# Patient Record
Sex: Male | Born: 1980 | Race: Black or African American | Hispanic: No | Marital: Single | Smoking: Current every day smoker
Health system: Southern US, Community
[De-identification: ages and names within clinical notes are randomized; demographics above are authoritative.]

## PROBLEM LIST (undated history)

## (undated) DIAGNOSIS — F1910 Other psychoactive substance abuse, uncomplicated: Secondary | ICD-10-CM

## (undated) DIAGNOSIS — E119 Type 2 diabetes mellitus without complications: Secondary | ICD-10-CM

## (undated) HISTORY — PX: HAND SURGERY: SHX662

---

## 2006-07-11 ENCOUNTER — Emergency Department: Payer: Self-pay | Admitting: Emergency Medicine

## 2014-10-13 ENCOUNTER — Ambulatory Visit
Admission: EM | Admit: 2014-10-13 | Discharge: 2014-10-13 | Disposition: A | Discharge status: Home / Self Care | Payer: 59 | Attending: Internal Medicine | Admitting: Internal Medicine

## 2014-10-13 DIAGNOSIS — H109 Unspecified conjunctivitis: Secondary | ICD-10-CM | POA: Diagnosis not present

## 2014-10-13 MED ORDER — TRIAMCINOLONE ACETONIDE 55 MCG/ACT NA AERO: 2.0000 | INHALATION_SPRAY | Freq: Every day | NASAL | Status: AC

## 2014-10-13 MED ORDER — PREDNISONE 50 MG PO TABS: 50.0000 mg | ORAL_TABLET | Freq: Every day | ORAL | Status: DC

## 2014-10-13 MED ORDER — KETOTIFEN FUMARATE 0.025 % OP SOLN: 2.0000 [drp] | Freq: Two times a day (BID) | OPHTHALMIC | Status: AC

## 2014-10-13 MED ORDER — ERYTHROMYCIN 5 MG/GM OP OINT: TOPICAL_OINTMENT | Freq: Every day | OPHTHALMIC | Status: AC

## 2014-10-13 NOTE — ED Notes (Signed)
Signature pad in pt room is not working for pt to do [employer]

## 2014-10-13 NOTE — ED Provider Notes (Addendum)
CSN: [id number]     Arrival date & time 10/13/14  1051 History   First MD Initiated Contact with Patient 10/13/14 1212     Chief Complaint  Patient presents with  . Itchy Eye  . 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  He does not wear contacts.  He does have some history of seasonal allergies, typically with some eye symptoms. Symptoms actually started a week ago, with a little bit of redness and irritation, itching, which he has been managing with over-the-counter Visine allergy drops.  Symptoms were a little more severe yesterday, and this morning he awakened with his eyes crusted, itchy and sore, mild photophobia little bit of puffiness of the lids and very red conjunctiva bilaterally. Minimal white discharge.  Little bit of headache which she attributes to having some light sensitivity with his eyes. He does have runny and congested nose. No sore throat, no fever, no cough. No nausea/vomiting, no diarrhea.  History reviewed. No pertinent past medical history. History reviewed. No pertinent past surgical history. History reviewed. No pertinent family history. History  Substance Use Topics  . Smoking status: Never Smoker   . Smokeless tobacco: Not on file  . Alcohol Use: Yes     Comment: social     Review of Systems  All other systems reviewed and are negative.   Allergies  Review of patient's allergies indicates no known allergies.  Home Medications   Prior to Admission medications   Medication Sig Start Date End Date Taking? Authorizing Provider  erythromycin ophthalmic ointment Place into both eyes at bedtime. Place a 1/2 inch ribbon of ointment into the lower eyelids. 10/13/14   Eustace Moore, MD  ketotifen (ZADITOR) 0.025 % ophthalmic solution Place 2 drops into both eyes 2 (two) times daily. 10/13/14   Eustace Moore, MD  predniSONE (DELTASONE)  50 MG tablet Take 1 tablet (50 mg total) by mouth daily. 10/13/14   Eustace Moore, MD  triamcinolone (NASACORT AQ) 55 MCG/ACT AERO nasal inhaler Place 2 sprays into the nose daily. 10/13/14   Eustace Moore, MD   BP 147/84 mmHg  Pulse 82  Temp(Src) 98.4 F (36.9 C) (Tympanic)  Ht  (1.753 m)  Wt 220 lb (99.791 kg)  BMI 32.47 kg/m2  SpO2 100% Physical Exam  Constitutional: He is oriented to person, place, and time. No distress.  Alert, nicely groomed  HENT:  Head: Atraumatic.  Eyes:  Conjugate gaze. Bilateral marked diffuse injection of conjunctivae, with mild chemosis in the lower conjunctival sacs. Very mild puffiness/erythema of the upper and lower lids, scant crusting along the lid margins. Mucousy material present in small amounts in the lower conjunctival sacs. No corneal irregularity seen on penlight exam.  Pupils equal round and react to light.  Neck: Neck supple.  Cardiovascular: Normal rate.   Pulmonary/Chest: No respiratory distress.  Abdominal: Soft. He exhibits no distension.  Musculoskeletal: Normal range of motion.  Neurological: He is alert and oriented to person, place, and time.  Skin: Skin is warm and dry.  No cyanosis  Nursing note and vitals reviewed.    Visual Acuity  Right Eye Distance: 20/20 Left Eye Distance: 20/20 Bilateral Distance: 20/20   ED Course  Procedures   MDM   1. Conjunctivitis of both eyes    Seems to be mostly allergic, with components of possible rebound  hyperemia from recent extended Visine use, and possible mild superinfection. See prescriptions below. Patient is given a note for work for the next 2 days as suggested by his employer. Follow-up eye doctor if not improving in the next 2 or 3 days; Dr. Sharrell Ku office number is given. Anticipate gradual improvement over the next several days, may take 2-3 weeks to subside.  New Prescriptions   ERYTHROMYCIN OPHTHALMIC OINTMENT    Place into both eyes at bedtime. Place a 1/2  inch ribbon of ointment into the lower eyelids.   KETOTIFEN (ZADITOR) 0.025 % OPHTHALMIC SOLUTION    Place 2 drops into both eyes 2 (two) times daily.   PREDNISONE (DELTASONE) 50 MG TABLET    Take 1 tablet (50 mg total) by mouth daily.   TRIAMCINOLONE (NASACORT AQ) 55 MCG/ACT AERO NASAL INHALER    Place 2 sprays into the nose daily.       Eustace Moore, MD 10/13/14 1237  Eustace Moore, MD 10/13/14 1250

## 2014-10-13 NOTE — Discharge Instructions (Signed)
May also be a little infected; may also be very red due to recent visine eye drops (can cause rebound increase in redness when you stop using them). Prescriptions for prednisone; nasacort nasal steroid; ketotifen eye drops (for allergy); and erythromycin eye ointment (first dose now then use at bedtime) sent to the pharmacy. See the eye doctor if not improving in a few days.  Allergic Conjunctivitis A thin membrane (conjunctiva) covers the eyeball and underside of the eyelids. Allergic conjunctivitis happens when the thin membrane gets irritated from things like animal dander, pollen, perfumes, or smoke (allergens). The membrane may become puffy (swollen) and red. Small bumps may form on the inside of the eyelids. Your eyes may get teary, itchy, or burn. It cannot be passed to another person (contagious).  HOME CARE  Wash your hands before and after applying medicated drops or creams.  Do not touch the drop or cream tube to your eye or eyelids.  Do not use your soft contacts. Throw them away. Use a new pair once recovery is complete.  Do not use your hard contacts. They need to be washed (sterilized) thoroughly after recovery is complete.  Put a cold cloth to your eye(s) if you have itching and burning. GET HELP RIGHT AWAY IF:   You are not feeling better in 2 to 3 days after treatment.  Your lids are sticky or stick together.  Fluid comes from the eye(s).  You become sensitive to light.  You have a temperature by mouth above 102 F (38.9 C).  You have pain in and around the eye(s).  You start to have vision problems. MAKE SURE YOU:   Understand these instructions.  Will watch your condition.  Will get help right away if you are not doing well or get worse. Document Released: 08/22/2009 Document Revised: 05/27/2011 Document Reviewed: 08/22/2009 [facility] Patient Information 2015 [place], [place]. This information is not intended to replace advice given to you by your health  care provider. Make sure you discuss any questions you have with your health care provider.

## 2014-10-13 NOTE — ED Notes (Signed)
Pt states " I woke up with both eyes red, I think I had pink eye last week but was relieved with over the counter drops, today it is back. Painful, watery and itchy." Visual acuity normal.

## 2017-11-13 ENCOUNTER — Inpatient Hospital Stay (HOSPITAL_COMMUNITY)
Admission: EM | Admit: 2017-11-13 | Discharge: 2017-11-15 | DRG: 639 | Disposition: A | Discharge status: Rehabilitation Facility | Payer: 59 | Attending: Family Medicine | Admitting: Family Medicine

## 2017-11-13 ENCOUNTER — Encounter (HOSPITAL_COMMUNITY): Payer: Self-pay | Admitting: Emergency Medicine

## 2017-11-13 ENCOUNTER — Other Ambulatory Visit: Payer: Self-pay

## 2017-11-13 DIAGNOSIS — Z7289 Other problems related to lifestyle: Secondary | ICD-10-CM | POA: Diagnosis present

## 2017-11-13 DIAGNOSIS — E1300 Other specified diabetes mellitus with hyperosmolarity without nonketotic hyperglycemic-hyperosmolar coma (NKHHC): Principal | ICD-10-CM | POA: Diagnosis present

## 2017-11-13 DIAGNOSIS — D509 Iron deficiency anemia, unspecified: Secondary | ICD-10-CM | POA: Diagnosis present

## 2017-11-13 DIAGNOSIS — F1020 Alcohol dependence, uncomplicated: Secondary | ICD-10-CM | POA: Diagnosis present

## 2017-11-13 DIAGNOSIS — D696 Thrombocytopenia, unspecified: Secondary | ICD-10-CM | POA: Diagnosis present

## 2017-11-13 DIAGNOSIS — E8881 Metabolic syndrome: Secondary | ICD-10-CM | POA: Diagnosis present

## 2017-11-13 DIAGNOSIS — Z789 Other specified health status: Secondary | ICD-10-CM

## 2017-11-13 DIAGNOSIS — E1110 Type 2 diabetes mellitus with ketoacidosis without coma: Secondary | ICD-10-CM | POA: Diagnosis present

## 2017-11-13 DIAGNOSIS — F17210 Nicotine dependence, cigarettes, uncomplicated: Secondary | ICD-10-CM | POA: Diagnosis present

## 2017-11-13 DIAGNOSIS — Z7952 Long term (current) use of systemic steroids: Secondary | ICD-10-CM

## 2017-11-13 DIAGNOSIS — Z6828 Body mass index (BMI) 28.0-28.9, adult: Secondary | ICD-10-CM

## 2017-11-13 DIAGNOSIS — Z9111 Patient's noncompliance with dietary regimen: Secondary | ICD-10-CM

## 2017-11-13 DIAGNOSIS — E860 Dehydration: Secondary | ICD-10-CM | POA: Diagnosis present

## 2017-11-13 DIAGNOSIS — F1090 Alcohol use, unspecified, uncomplicated: Secondary | ICD-10-CM | POA: Diagnosis present

## 2017-11-13 DIAGNOSIS — E119 Type 2 diabetes mellitus without complications: Secondary | ICD-10-CM

## 2017-11-13 DIAGNOSIS — E669 Obesity, unspecified: Secondary | ICD-10-CM | POA: Diagnosis present

## 2017-11-13 DIAGNOSIS — Z833 Family history of diabetes mellitus: Secondary | ICD-10-CM

## 2017-11-13 DIAGNOSIS — R739 Hyperglycemia, unspecified: Secondary | ICD-10-CM

## 2017-11-13 DIAGNOSIS — E876 Hypokalemia: Secondary | ICD-10-CM | POA: Diagnosis present

## 2017-11-13 DIAGNOSIS — Z79899 Other long term (current) drug therapy: Secondary | ICD-10-CM

## 2017-11-13 DIAGNOSIS — E1100 Type 2 diabetes mellitus with hyperosmolarity without nonketotic hyperglycemic-hyperosmolar coma (NKHHC): Secondary | ICD-10-CM | POA: Diagnosis present

## 2017-11-13 LAB — BASIC METABOLIC PANEL
Anion gap: 14 (ref 5–15)
BUN: <5 mg/dL — ABNORMAL LOW (ref 6–20)
CHLORIDE: 92 mmol/L — AB (ref 98–111)
CO2: 28 mmol/L (ref 22–32)
CREATININE: 0.69 mg/dL (ref 0.61–1.24)
Calcium: 9.3 mg/dL (ref 8.9–10.3)
Glucose, Bld: 500 mg/dL — ABNORMAL HIGH (ref 70–99)
Potassium: 3.7 mmol/L (ref 3.5–5.1)
Sodium: 134 mmol/L — ABNORMAL LOW (ref 135–145)

## 2017-11-13 LAB — URINALYSIS, ROUTINE W REFLEX MICROSCOPIC
Bacteria, UA: NONE SEEN
Bilirubin Urine: NEGATIVE
Glucose, UA: >=500 mg/dL — AB
HGB URINE DIPSTICK: NEGATIVE
Ketones, ur: 5 mg/dL — AB
LEUKOCYTES UA: NEGATIVE
Nitrite: NEGATIVE
Protein, ur: NEGATIVE mg/dL
SPECIFIC GRAVITY, URINE: 1.033 — AB (ref 1.005–1.030)
pH: 6 (ref 5.0–8.0)

## 2017-11-13 LAB — CBC
HCT: 36.8 % — ABNORMAL LOW (ref 39.0–52.0)
HEMATOCRIT: 36.3 % — AB (ref 39.0–52.0)
Hemoglobin: 12.4 g/dL — ABNORMAL LOW (ref 13.0–17.0)
Hemoglobin: 12.1 g/dL — ABNORMAL LOW (ref 13.0–17.0)
MCH: 26.6 pg (ref 26.0–34.0)
MCH: 26.4 pg (ref 26.0–34.0)
MCHC: 33.7 g/dL (ref 30.0–36.0)
MCHC: 33.3 g/dL (ref 30.0–36.0)
MCV: 79 fL (ref 78.0–100.0)
MCV: 79.1 fL (ref 78.0–100.0)
PLATELETS: 174 10*3/uL (ref 150–400)
Platelets: 183 10*3/uL (ref 150–400)
RBC: 4.66 MIL/uL (ref 4.22–5.81)
RBC: 4.59 MIL/uL (ref 4.22–5.81)
RDW: 14.1 % (ref 11.5–15.5)
RDW: 14.1 % (ref 11.5–15.5)
WBC: 5.4 10*3/uL (ref 4.0–10.5)
WBC: 6 10*3/uL (ref 4.0–10.5)

## 2017-11-13 LAB — GLUCOSE, CAPILLARY
Glucose-Capillary: 314 mg/dL — ABNORMAL HIGH (ref 70–99)
Glucose-Capillary: 264 mg/dL — ABNORMAL HIGH (ref 70–99)
Glucose-Capillary: 247 mg/dL — ABNORMAL HIGH (ref 70–99)

## 2017-11-13 LAB — HEMOGLOBIN A1C
HEMOGLOBIN A1C: 13.5 % — AB (ref 4.8–5.6)
Mean Plasma Glucose: 340.75 mg/dL

## 2017-11-13 LAB — CREATININE, SERUM
Creatinine, Ser: 0.63 mg/dL (ref 0.61–1.24)
GFR calc non Af Amer: >60 mL/min (ref >60)

## 2017-11-13 LAB — CBG MONITORING, ED: Glucose-Capillary: 460 mg/dL — ABNORMAL HIGH (ref 70–99)

## 2017-11-13 MED ORDER — HYDROCODONE-ACETAMINOPHEN 5-325 MG PO TABS: 1.0000 | ORAL_TABLET | ORAL | Status: DC | PRN

## 2017-11-13 MED ORDER — IPRATROPIUM BROMIDE 0.02 % IN SOLN: 0.5000 mg | Freq: Four times a day (QID) | RESPIRATORY_TRACT | Status: DC

## 2017-11-13 MED ORDER — INSULIN ASPART 100 UNIT/ML ~~LOC~~ SOLN
0.0000 [IU] | Freq: Every day | SUBCUTANEOUS | Status: DC
  Administered 2017-11-13: 2 [IU] via SUBCUTANEOUS

## 2017-11-13 MED ORDER — LORAZEPAM 1 MG PO TABS
1.0000 mg | ORAL_TABLET | Freq: Four times a day (QID) | ORAL | Status: DC | PRN
  Administered 2017-11-13 – 2017-11-14 (×2): 1 mg via ORAL
  Filled 2017-11-13 (×2): qty 1

## 2017-11-13 MED ORDER — INSULIN GLARGINE 100 UNIT/ML ~~LOC~~ SOLN
18.0000 [IU] | Freq: Every day | SUBCUTANEOUS | Status: DC
  Filled 2017-11-13: qty 0.18

## 2017-11-13 MED ORDER — SODIUM CHLORIDE 0.9 % IV BOLUS
1000.0000 mL | Freq: Once | INTRAVENOUS | Status: AC
  Administered 2017-11-13: 1000 mL via INTRAVENOUS

## 2017-11-13 MED ORDER — FOLIC ACID 1 MG PO TABS
1.0000 mg | ORAL_TABLET | Freq: Every day | ORAL | Status: DC
  Administered 2017-11-13 – 2017-11-15 (×3): 1 mg via ORAL
  Filled 2017-11-13 (×3): qty 1

## 2017-11-13 MED ORDER — INSULIN ASPART 100 UNIT/ML ~~LOC~~ SOLN
0.0000 [IU] | Freq: Three times a day (TID) | SUBCUTANEOUS | Status: DC
  Administered 2017-11-13: 11 [IU] via SUBCUTANEOUS
  Administered 2017-11-13: 8 [IU] via SUBCUTANEOUS
  Administered 2017-11-14 (×3): 11 [IU] via SUBCUTANEOUS
  Administered 2017-11-15: 8 [IU] via SUBCUTANEOUS
  Administered 2017-11-15: 5 [IU] via SUBCUTANEOUS

## 2017-11-13 MED ORDER — LORAZEPAM 2 MG/ML IJ SOLN: 1.0000 mg | Freq: Four times a day (QID) | INTRAMUSCULAR | Status: DC | PRN

## 2017-11-13 MED ORDER — ONDANSETRON HCL 4 MG/2ML IJ SOLN: 4.0000 mg | Freq: Four times a day (QID) | INTRAMUSCULAR | Status: DC | PRN

## 2017-11-13 MED ORDER — INSULIN GLARGINE 100 UNIT/ML ~~LOC~~ SOLN
10.0000 [IU] | Freq: Every day | SUBCUTANEOUS | Status: DC
  Administered 2017-11-13: 10 [IU] via SUBCUTANEOUS
  Filled 2017-11-13: qty 0.1

## 2017-11-13 MED ORDER — INSULIN ASPART 100 UNIT/ML ~~LOC~~ SOLN
6.0000 [IU] | Freq: Three times a day (TID) | SUBCUTANEOUS | Status: DC
  Administered 2017-11-13 – 2017-11-14 (×2): 6 [IU] via SUBCUTANEOUS

## 2017-11-13 MED ORDER — SODIUM CHLORIDE 0.9 % IV SOLN
INTRAVENOUS | Status: DC
  Administered 2017-11-13 – 2017-11-14 (×2): via INTRAVENOUS

## 2017-11-13 MED ORDER — HEPARIN SODIUM (PORCINE) 5000 UNIT/ML IJ SOLN
5000.0000 [IU] | Freq: Three times a day (TID) | INTRAMUSCULAR | Status: DC
  Administered 2017-11-13 – 2017-11-15 (×6): 5000 [IU] via SUBCUTANEOUS
  Filled 2017-11-13 (×6): qty 1

## 2017-11-13 MED ORDER — LIVING WELL WITH DIABETES BOOK
Freq: Once | Status: AC
  Administered 2017-11-13: 14:00:00
  Filled 2017-11-13: qty 1

## 2017-11-13 MED ORDER — INSULIN ASPART PROT & ASPART (70-30 MIX) 100 UNIT/ML ~~LOC~~ SUSP
8.0000 [IU] | Freq: Once | SUBCUTANEOUS | Status: AC
  Administered 2017-11-13: 8 [IU] via SUBCUTANEOUS
  Filled 2017-11-13: qty 10

## 2017-11-13 MED ORDER — ADULT MULTIVITAMIN W/MINERALS CH
1.0000 | ORAL_TABLET | Freq: Every day | ORAL | Status: DC
  Administered 2017-11-13 – 2017-11-15 (×3): 1 via ORAL
  Filled 2017-11-13 (×3): qty 1

## 2017-11-13 MED ORDER — VITAMIN B-1 100 MG PO TABS
100.0000 mg | ORAL_TABLET | Freq: Every day | ORAL | Status: DC
  Administered 2017-11-13 – 2017-11-15 (×3): 100 mg via ORAL
  Filled 2017-11-13 (×3): qty 1

## 2017-11-13 MED ORDER — ACETAMINOPHEN 650 MG RE SUPP: 650.0000 mg | Freq: Four times a day (QID) | RECTAL | Status: DC | PRN

## 2017-11-13 MED ORDER — INSULIN GLARGINE 100 UNIT/ML ~~LOC~~ SOLN
8.0000 [IU] | Freq: Once | SUBCUTANEOUS | Status: AC
  Administered 2017-11-13: 8 [IU] via SUBCUTANEOUS
  Filled 2017-11-13: qty 0.08

## 2017-11-13 MED ORDER — THIAMINE HCL 100 MG/ML IJ SOLN: 100.0000 mg | Freq: Every day | INTRAMUSCULAR | Status: DC

## 2017-11-13 MED ORDER — ONDANSETRON HCL 4 MG PO TABS: 4.0000 mg | ORAL_TABLET | Freq: Four times a day (QID) | ORAL | Status: DC | PRN

## 2017-11-13 MED ORDER — ACETAMINOPHEN 325 MG PO TABS: 650.0000 mg | ORAL_TABLET | Freq: Four times a day (QID) | ORAL | Status: DC | PRN

## 2017-11-13 NOTE — H&P (Signed)
History and Physical        Hospital Admission Note Date: 11/13/2017  Patient name: Andre Espinoza Medical record number: [id number] Date of birth: 1980/10/12 Age: 37 y.o. Gender: male  PCP: Patient, No Pcp Per    Patient coming from: [facility]  I have reviewed all records in the [facility].    Chief Complaint:  Polydipsia, polyuria, weight loss over the last 2 months  HPI: Patient is a 37 year old male with no significant past medical history before except alcohol abuse presented to ER from [facility] for hyperglycemia.  Patient reported that he has been in [facility] for last 2 days for alcohol detox.  His CBG was noted to be 557 and was given NovoLog 14 units this morning and IV fluid bolus by EMS.  Patient was complaining of increased frequency of urination, excessive thirst and 40 pound weight loss in last 2 months.  Patient states that his grandmother and his brother has diabetes. In ED, patient was found to have CBG of 460.  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  ED work-up/course:  Temp 97.9, respiratory rate 17, pulse 94, BP 124/94 BMET showed sodium 134, bicarb 28, anion gap of 14, creatinine 0.69.  CBC unremarkable except hemoglobin 12.4 Patient has also received 8 units of NovoLog in the ED.  Review of Systems: Positives marked in 'bold' Constitutional: Denies fever, chills, diaphoresis, +poor appetite and fatigue.  HEENT: Denies photophobia, eye pain, redness, hearing loss, ear pain, congestion, sore throat, rhinorrhea, sneezing, mouth sores, trouble swallowing, neck pain, neck stiffness and tinnitus.   Respiratory: Denies SOB, DOE, cough, chest tightness,  and wheezing.   Cardiovascular: Denies chest pain, palpitations and leg swelling.  Gastrointestinal: Denies nausea, vomiting, abdominal pain, diarrhea,  constipation, blood in stool and abdominal distention.  Genitourinary: Increased urination, no dysuria, hematuria, flank pain and difficulty urinating.  Musculoskeletal: Denies myalgias, back pain, joint swelling, arthralgias and gait problem.  Skin: Denies pallor, rash and wound.  Neurological: Denies dizziness, seizures, syncope, weakness, light-headedness, numbness and headaches.  Hematological: Denies adenopathy. Easy bruising, personal or family bleeding history  Psychiatric/Behavioral: Denies suicidal ideation, mood changes, confusion, nervousness, sleep disturbance and agitation  Past Medical History: History reviewed. No pertinent past medical history.  Past Surgical History:  Procedure Laterality Date  . HAND SURGERY Left     Medications: Prior to Admission medications   Medication Sig Start Date End Date Taking? Authorizing Provider  erythromycin ophthalmic ointment Place into both eyes at bedtime. Place a 1/2 inch ribbon of ointment into the lower eyelids. 10/13/14   Isa Rankin, MD  ketotifen (ZADITOR) 0.025 % ophthalmic solution Place 2 drops into both eyes 2 (two) times daily. 10/13/14   Isa Rankin, MD  predniSONE (DELTASONE) 50 MG tablet Take 1 tablet (50 mg total) by mouth daily. 10/13/14   Isa Rankin, MD  triamcinolone (NASACORT AQ) 55 MCG/ACT AERO nasal inhaler Place 2 sprays into the nose daily. 10/13/14   Isa Rankin, MD    Allergies:  No Known Allergies  Social History:  reports that he has been smoking cigarettes. He has never used smokeless tobacco.  He reports that he drinks alcohol. He reports that he does not use drugs.  Family History: No family history on file.  Physical Exam: Blood pressure (!) 129/95, pulse 87, temperature 97.9 F (36.6 C), resp. rate 16, height 5' 8.5" (1.74 m), weight 86.6 kg, SpO2 100 %. General: Alert, awake, oriented x3, in no acute distress, dry mucosal membranes. Eyes: pink  conjunctiva,anicteric sclera, pupils equal and reactive to light and accomodation, HEENT: normocephalic, atraumatic, oropharynx clear Neck: supple, no masses or lymphadenopathy, no goiter, no bruits, no JVD CVS: Regular rate and rhythm, without murmurs, rubs or gallops. No lower extremity edema Resp : Clear to auscultation bilaterally, no wheezing, rales or rhonchi. GI : Soft, nontender, nondistended, positive bowel sounds, no masses. No hepatomegaly. No hernia.  Musculoskeletal: No clubbing or cyanosis, positive pedal pulses. No contracture. ROM intact  Neuro: Grossly intact, no focal neurological deficits, strength 5/5 upper and lower extremities bilaterally Psych: alert and oriented x 3, normal mood and affect Skin: no rashes or lesions, warm and dry   LABS on Admission: I have personally reviewed all the labs and imagings below    Basic Metabolic Panel: Recent Labs  Lab 11/13/17 0939  NA 134*  K 3.7  CL 92*  CO2 28  GLUCOSE 500*  BUN <5*  CREATININE 0.69  CALCIUM 9.3   Liver Function Tests: No results for input(s): AST, ALT, ALKPHOS, BILITOT, PROT, ALBUMIN in the last 168 hours. No results for input(s): LIPASE, AMYLASE in the last 168 hours. No results for input(s): AMMONIA in the last 168 hours. CBC: Recent Labs  Lab 11/13/17 0939  WBC 5.4  HGB 12.4*  HCT 36.8*  MCV 79.0  PLT 174   Cardiac Enzymes: No results for input(s): CKTOTAL, CKMB, CKMBINDEX, TROPONINI in the last 168 hours. BNP: Invalid input(s): POCBNP CBG: Recent Labs  Lab 11/13/17 0931  GLUCAP 460*    Radiological Exams on Admission:  No results found.    EKG: Independently reviewed.  None   Assessment/Plan Principal Problem:   Type 2 diabetes mellitus with hyperosmolar nonketotic hyperglycemia (HCC)- new onset  -Anion gap 14, bicarb 28, UA with 5 ketones -Patient has received IV fluid hydration and total of 22 units of insulin so far by EMS and in ED -Currently no nausea, vomiting or  acidosis. -Will place on carb modified diet, Lantus 10 units x 1 now, moderate sliding scale insulin -Hemoglobin A1c, placed nutrition consult, diabetic coordinator consult -Patient will need PCP at the time of discharge  Active Problems:   Alcohol use -Patient is currently in alcohol detox at [facility] -Placed on CIWA scale with Ativan.  Continue thiamine, folate, MVI    Dehydration -Continue IV fluid hydration  DVT prophylaxis: Heparin subcu  CODE STATUS: Full code  Consults called: None  Family Communication: Admission, patients condition and plan of care including tests being ordered have been discussed with the patient who indicates understanding and agree with the plan and Code Status  Admission status:   Disposition plan: Further plan will depend as patient's clinical course evolves and further radiologic and laboratory data become available.    At the time of admission, it appears that the appropriate admission status for this patient is observation. This is judged to be reasonable and necessary in order to provide the required intensity of service to ensure the patient's safety given the presenting symptoms hyperglycemia, dehydration, new onset diabetes, physical exam findings, and initial radiographic and laboratory data in the context of their chronic comorbidities.  The medical decision making on this patient was of high complexity and the patient is at high risk for clinical deterioration, therefore this is a level 3 visit.   Time Spent on Admission: 60-minutes    Albion Weatherholtz M.D. [facility] 11/13/2017, 11:05 AM Pager: [phone]  If 7PM-7AM, please contact night-coverage [email or web address] Password [email or web address]

## 2017-11-13 NOTE — ED Triage Notes (Signed)
[facility] CBG 557. Pt was given Novolog 14 units around 630am this morning. Pt doesn't have hx DM, but family hx of DM.  Vitals: 136/88, HR 94, R18, 100% on RA. 18g in left AC given NS 400ml in route.  [facility]  Pt reports that for months he has had frequent urination.

## 2017-11-13 NOTE — Progress Notes (Signed)
Inpatient Diabetes Program Recommendations  AACE/ADA: New Consensus Statement on Inpatient Glycemic Control (2015)  Target Ranges:  Prepandial:   less than 140 mg/dL      Peak postprandial:   less than 180 mg/dL (1-2 hours)      Critically ill patients:  140 - 180 mg/dL   Lab Results  Component Value Date   GLUCAP 314 (H) 11/13/2017   HGBA1C 13.5 (H) 11/13/2017    Review of Glycemic Control  Diabetes history: New-onset Outpatient Diabetes medications: None Current orders for Inpatient glycemic control: Lantus 10 units QD, Novolog 0-15 units tidwc and hs  HgbA1C - 13.5% 40 pound weight loss in past 2 months + family hx DM Per care management - [facility] and will be able to get insulin pens there at discharge.  Inpatient Diabetes Program Recommendations:     Increase Lantus to 18 units QD Add Novolog 6 units tidwc for meal coverage insulin  Spoke with pt at length regarding new diagnosis of DM. Pt states he does not have insurance or PCP. [facility] Will be able to get insulin at reduced rate at discharge. Pt prefers insulin pen. Discussed patho of diabetes, impact of nutrition, exercise, stress, sickness, and medications on diabetes control. Discussed HgbA1C of 13.5% and goal of 7%. Ordered Living Well book, videos and spoke with RN about allowing pt to stick his finger and give his insulin while hospitalized. Will teach insulin pen administration in am.   Continue to follow.  Thank you. Ailene Ardshonda Laquinton Bihm, RD, LDN, CDE Inpatient Diabetes Coordinator [phone]

## 2017-11-13 NOTE — ED Provider Notes (Addendum)
[facility]-EMERGENCY DEPT Provider Note   CSN: [id number] Arrival date & time: 11/13/17  3086     History   Chief Complaint Chief Complaint  Patient presents with  . Hyperglycemia  . Urinary Frequency    HPI Andre Espinoza is a 37 y.o. male.  Patient with polyuria, thirst, weight loss, over course of past 2 months. Symptoms gradual onset, moderate, slowly worse. Has been  Rehab for past 2 days for etoh abuse, and was found to have blood sugars in 500 range. No hx dm, but does not go to doctor. +fam hx dm. Patient with nausea, vomiting, diarrhea in past day, a couple episodes. No bloody or bilious emesis. No abd pain. No fever or chills. Was given novolog 14 units at 0630 this AM - blood sugar was 577.  The history is provided by the patient and the EMS personnel.  Hyperglycemia  Associated symptoms: increased thirst, polyuria and vomiting   Associated symptoms: no abdominal pain, no chest pain, no confusion, no fever and no shortness of breath   Urinary Frequency  Pertinent negatives include no chest pain, no abdominal pain, no headaches and no shortness of breath.    History reviewed. No pertinent past medical history.  There are no active problems to display for this patient.   Past Surgical History:  Procedure Laterality Date  . HAND SURGERY Left         Home Medications    Prior to Admission medications   Medication Sig Start Date End Date Taking? Authorizing Provider  erythromycin ophthalmic ointment Place into both eyes at bedtime. Place a 1/2 inch ribbon of ointment into the lower eyelids. 10/13/14   Isa Rankin, MD  ketotifen (ZADITOR) 0.025 % ophthalmic solution Place 2 drops into both eyes 2 (two) times daily. 10/13/14   Isa Rankin, MD  predniSONE (DELTASONE) 50 MG tablet Take 1 tablet (50 mg total) by mouth daily. 10/13/14   Isa Rankin, MD  triamcinolone (NASACORT AQ) 55 MCG/ACT AERO nasal inhaler Place 2  sprays into the nose daily. 10/13/14   Isa Rankin, MD    Family History No family history on file.  Social History Social History   Tobacco Use  . Smoking status: Current Every Day Smoker    Types: Cigarettes  . Smokeless tobacco: Never Used  Substance Use Topics  . Alcohol use: Yes  . Drug use: No     Allergies   Patient has no known allergies.   Review of Systems Review of Systems  Constitutional: Negative for fever.  HENT: Negative for sore throat.   Eyes: Negative for redness.  Respiratory: Negative for shortness of breath.   Cardiovascular: Negative for chest pain.  Gastrointestinal: Positive for diarrhea and vomiting. Negative for abdominal pain.  Endocrine: Positive for polydipsia and polyuria.  Genitourinary: Positive for frequency. Negative for flank pain.  Musculoskeletal: Negative for back pain and neck pain.  Skin: Negative for rash.  Neurological: Negative for headaches.  Hematological: Does not bruise/bleed easily.  Psychiatric/Behavioral: Negative for confusion.     Physical Exam Updated Vital Signs BP (!) 124/94 (BP Location: Right Arm)   Pulse 94   Temp 97.9 F (36.6 C)   Resp 17   Ht 1.74 m (5' 8.5")   Wt 86.6 kg   SpO2 98%   BMI 28.62 kg/m   Physical Exam  Constitutional: He is oriented to person, place, and time. He appears well-developed and well-nourished.  HENT:  Mouth/Throat: Oropharynx  is clear and moist.  Eyes: Conjunctivae are normal.  Neck: Neck supple. No tracheal deviation present.  Cardiovascular: Normal rate, regular rhythm, normal heart sounds and intact distal pulses. Exam reveals no gallop and no friction rub.  No murmur heard. Pulmonary/Chest: Effort normal and breath sounds normal. No accessory muscle usage. No respiratory distress.  Abdominal: Soft. Bowel sounds are normal. He exhibits no distension. There is no tenderness.  Genitourinary:  Genitourinary Comments: No cva tenderness  Musculoskeletal: He  exhibits no edema.  Neurological: He is alert and oriented to person, place, and time.  Skin: Skin is warm and dry. No rash noted.  Psychiatric: He has a normal mood and affect.  Nursing note and vitals reviewed.    ED Treatments / Results  Labs (all labs ordered are listed, but only abnormal results are displayed) Results for orders placed or performed during the hospital encounter of 11/13/17  Basic metabolic panel  Result Value Ref Range   Sodium 134 (L) 135 - 145 mmol/L   Potassium 3.7 3.5 - 5.1 mmol/L   Chloride 92 (L) 98 - 111 mmol/L   CO2 28 22 - 32 mmol/L   Glucose, Bld 500 (H) 70 - 99 mg/dL   BUN <5 (L) 6 - 20 mg/dL   Creatinine, Ser 6.38 0.61 - 1.24 mg/dL   Calcium 9.3 8.9 - 75.6 mg/dL   GFR calc non Af Amer >60 >60 mL/min   GFR calc Af Amer >60 >60 mL/min   Anion gap 14 5 - 15  CBC  Result Value Ref Range   WBC 5.4 4.0 - 10.5 K/uL   RBC 4.66 4.22 - 5.81 MIL/uL   Hemoglobin 12.4 (L) 13.0 - 17.0 g/dL   HCT 43.3 (L) 29.5 - 18.8 %   MCV 79.0 78.0 - 100.0 fL   MCH 26.6 26.0 - 34.0 pg   MCHC 33.7 30.0 - 36.0 g/dL   RDW 41.6 60.6 - 30.1 %   Platelets 174 150 - 400 K/uL  Urinalysis, Routine w reflex microscopic  Result Value Ref Range   Color, Urine STRAW (A) YELLOW   APPearance CLEAR CLEAR   Specific Gravity, Urine 1.033 (H) 1.005 - 1.030   pH 6.0 5.0 - 8.0   Glucose, UA >=500 (A) NEGATIVE mg/dL   Hgb urine dipstick NEGATIVE NEGATIVE   Bilirubin Urine NEGATIVE NEGATIVE   Ketones, ur 5 (A) NEGATIVE mg/dL   Protein, ur NEGATIVE NEGATIVE mg/dL   Nitrite NEGATIVE NEGATIVE   Leukocytes, UA NEGATIVE NEGATIVE   WBC, UA 0-5 0 - 5 WBC/hpf   Bacteria, UA NONE SEEN NONE SEEN  CBG monitoring, ED  Result Value Ref Range   Glucose-Capillary 460 (H) 70 - 99 mg/dL    EKG None  Radiology No results found.  Procedures Procedures (including critical care time)  Medications Ordered in ED Medications  sodium chloride 0.9 % bolus 1,000 mL (has no administration in  time range)  sodium chloride 0.9 % bolus 1,000 mL (has no administration in time range)  insulin aspart protamine- aspart (NOVOLOG MIX 70/30) injection 8 Units (has no administration in time range)     Initial Impression / Assessment and Plan / ED Course  I have reviewed the triage vital signs and the nursing notes.  Pertinent labs & imaging results that were available during my care of the patient were reviewed by me and considered in my medical decision making (see chart for details).  Iv ns bolus.   Labs sent.  Labs reviewed -  glucose 500. novolog sq. Additional ns bolus.   As new onset diabetes, blood sugar 577 initial, need for diabetic teaching, hydration, etc - will consult medicine for admission.  Reviewed nursing notes and prior charts for additional history.   Additional po and iv fluids.     Final Clinical Impressions(s) / ED Diagnoses   Final diagnoses:  None    ED Discharge Orders    None           Cathren LaineSteinl, Kenishia Plack, MD 12/15/17 1627

## 2017-11-13 NOTE — Care Management Note (Signed)
Case Management Note  Patient Details  Name: Andre Espinoza MRN: [id number] Date of Birth: 27-Apr-1980  Subjective/Objective:  Admitted w/DM2. [facility] Facility can transport back-just call when ready-CSW notified.                  Action/Plan:d/c back to detox facility.   Expected Discharge Date:                  Expected Discharge Plan:  OP Rehab  In-House Referral:  Clinical Social Work  Discharge planning Services  CM Consult  Post Acute Care Choice:    Choice offered to:     DME Arranged:    DME Agency:     HH Arranged:    HH Agency:     Status of Service:  In process, will continue to follow  If discussed at Long Length of Stay Meetings, dates discussed:    Additional Comments:  Lanier ClamMahabir, Andre Arntson, RN 11/13/2017, 12:59 PM

## 2017-11-13 NOTE — Progress Notes (Signed)
After detailed instruction pt was competently able to self administer his evening Novolog and Lantus insulins.  He verbalizes comfort with doing so when discharged.

## 2017-11-13 NOTE — Progress Notes (Signed)
Report from [place]ica, [place].  Care assumed for pt at this time. Pt resting, denies any c/o.  Assessment unchanged from previous RN assessment.

## 2017-11-13 NOTE — ED Notes (Signed)
ED TO INPATIENT HANDOFF REPORT  Name/Age/Gender Laverta Baltimore 37 y.o. male  Code Status   Home/SNF/Other Rehab  Chief Complaint hyperglycemia  Level of Care/Admitting Diagnosis ED Disposition    ED Disposition Condition Comment   Admit  Hospital Area: [place] [[id number]]  Level of Care: Telemetry [5]  Admit to tele based on following criteria: Other see comments  Comments: Dehydration, new onset diabetes  Diagnosis: Type 2 diabetes mellitus with hyperosmolar nonketotic hyperglycemia ([place]) [919166]  Admitting Physician: RAI, Nap[place] K [[id number]]  Attending Physician: RAI, RIPUDEEP K [[id number]]  PT Class (Do Not Modify): Observation [104]  PT Acc Code (Do Not Modify): Observation [10022]       Medical History History reviewed. No pertinent past medical history.  Allergies No Known Allergies  IV Location/Drains/Wounds Patient Lines/Drains/Airways Status   Active Line/Drains/Airways    Name:   Placement date:   Placement time:   Site:   Days:   Peripheral IV 11/13/17 Left Antecubital   11/13/17    0911    Antecubital   less than 1          Labs/Imaging Results for orders placed or performed during the hospital encounter of 11/13/17 (from the past 48 hour(s))  CBG monitoring, ED     Status: Abnormal   Collection Time: 11/13/17  9:31 AM  Result Value Ref Range   Glucose-Capillary 460 (H) 70 - 99 mg/dL  Basic metabolic panel     Status: Abnormal   Collection Time: 11/13/17  9:39 AM  Result Value Ref Range   Sodium 134 (L) 135 - 145 mmol/L   Potassium 3.7 3.5 - 5.1 mmol/L   Chloride 92 (L) 98 - 111 mmol/L   CO2 28 22 - 32 mmol/L   Glucose, Bld 500 (H) 70 - 99 mg/dL   BUN <5 (L) 6 - 20 mg/dL   Creatinine, Ser 0.69 0.61 - 1.24 mg/dL   Calcium 9.3 8.9 - 10.3 mg/dL   GFR calc non Af Amer >60 >60 mL/min   GFR calc Af Amer >60 >60 mL/min    Comment: (NOTE) The eGFR has been calculated using the CKD EPI equation. This calculation has not been  validated in all clinical situations. eGFR's persistently <60 mL/min signify possible Chronic Kidney Disease.    Anion gap 14 5 - 15    Comment: Performed at [facility], [place] [address], [place], [place] [address]  CBC     Status: Abnormal   Collection Time: 11/13/17  9:39 AM  Result Value Ref Range   WBC 5.4 4.0 - 10.5 K/uL   RBC 4.66 4.22 - 5.81 MIL/uL   Hemoglobin 12.4 (L) 13.0 - 17.0 g/dL   HCT 36.8 (L) 39.0 - 52.0 %   MCV 79.0 78.0 - 100.0 fL   MCH 26.6 26.0 - 34.0 pg   MCHC 33.7 30.0 - 36.0 g/dL   RDW 14.1 11.5 - 15.5 %   Platelets 174 150 - 400 K/uL    Comment: Performed at [facility], [place] [address] [place],  [address]  Urinalysis, Routine w reflex microscopic     Status: Abnormal   Collection Time: 11/13/17  9:39 AM  Result Value Ref Range   Color, Urine STRAW (A) YELLOW   APPearance CLEAR CLEAR   Specific Gravity, Urine 1.033 (H) 1.005 - 1.030   pH 6.0 5.0 - 8.0   Glucose, UA >=500 (A) NEGATIVE mg/dL   Hgb urine dipstick NEGATIVE NEGATIVE  Bilirubin Urine NEGATIVE NEGATIVE   Ketones, ur 5 (A) NEGATIVE mg/dL   Protein, ur NEGATIVE NEGATIVE mg/dL   Nitrite NEGATIVE NEGATIVE   Leukocytes, UA NEGATIVE NEGATIVE   WBC, UA 0-5 0 - 5 WBC/hpf   Bacteria, UA NONE SEEN NONE SEEN    Comment: Performed at [facility], [place] [address], [place], [place] [address]   No results found.  Pending Labs Unresulted Labs (From admission, onward)    Start     Ordered   11/13/17 1100  Hemoglobin A1c  Once,   R    Comments:  To assess prior glycemic control    11/13/17 1100   Signed and Held  HIV antibody (Routine Testing)  Once,   R     Signed and Held   Signed and Occupational hygienist morning,   R     Signed and Held   Signed and Held  CBC  Tomorrow morning,   R     Signed and Held   Signed and Held  CBC  (heparin)  Once,   R    Comments:  Baseline for heparin therapy IF NOT ALREADY DRAWN.   Notify MD if PLT < 100 K.    Signed and Held   Signed and Held  Creatinine, serum  (heparin)  Once,   R    Comments:  Baseline for heparin therapy IF NOT ALREADY DRAWN.    Signed and Held          Vitals/Pain Today's Vitals   11/13/17 5997 11/13/17 1036 11/13/17 1040 11/13/17 1100  BP:  (!) 129/95  (!) 137/101  Pulse:  87  96  Resp:  16    Temp: 97.9 F (36.6 C)     TempSrc:      SpO2:  100%  99%  Weight: 86.6 kg     Height: 5' 8.5" (1.74 m)     PainSc:   Asleep     Isolation Precautions No active isolations  Medications Medications  sodium chloride 0.9 % bolus 1,000 mL (1,000 mLs Intravenous New Bag/Given 11/13/17 1052)  insulin aspart (novoLOG) injection 0-5 Units (has no administration in time range)  insulin aspart (novoLOG) injection 0-15 Units (has no administration in time range)  insulin glargine (LANTUS) injection 10 Units (has no administration in time range)  0.9 %  sodium chloride infusion (has no administration in time range)  sodium chloride 0.9 % bolus 1,000 mL (1,000 mLs Intravenous New Bag/Given 11/13/17 1035)  insulin aspart protamine- aspart (NOVOLOG MIX 70/30) injection 8 Units (8 Units Subcutaneous Given 11/13/17 1052)    Mobility walks

## 2017-11-13 NOTE — Progress Notes (Signed)
Patient has no health insurance-provided w/pcp listing-chose [facility]-will asst patient w/calling for pcp f/u appt-he will be able to get insulin pen there @ d/c.

## 2017-11-13 NOTE — ED Notes (Signed)
Bed: [id number] Expected date:  Expected time:  Means of arrival:  Comments: EMS 37 yo hyperglycemia

## 2017-11-14 DIAGNOSIS — E1100 Type 2 diabetes mellitus with hyperosmolarity without nonketotic hyperglycemic-hyperosmolar coma (NKHHC): Secondary | ICD-10-CM

## 2017-11-14 DIAGNOSIS — E1110 Type 2 diabetes mellitus with ketoacidosis without coma: Secondary | ICD-10-CM | POA: Diagnosis present

## 2017-11-14 LAB — CBC
HCT: 34.2 % — ABNORMAL LOW (ref 39.0–52.0)
Hemoglobin: 11.4 g/dL — ABNORMAL LOW (ref 13.0–17.0)
MCH: 26.6 pg (ref 26.0–34.0)
MCHC: 33.3 g/dL (ref 30.0–36.0)
MCV: 79.7 fL (ref 78.0–100.0)
PLATELETS: 162 10*3/uL (ref 150–400)
RBC: 4.29 MIL/uL (ref 4.22–5.81)
RDW: 14.1 % (ref 11.5–15.5)
WBC: 5.8 10*3/uL (ref 4.0–10.5)

## 2017-11-14 LAB — BASIC METABOLIC PANEL
ANION GAP: 8 (ref 5–15)
CALCIUM: 8.7 mg/dL — AB (ref 8.9–10.3)
CO2: 28 mmol/L (ref 22–32)
Chloride: 100 mmol/L (ref 98–111)
Creatinine, Ser: 0.56 mg/dL — ABNORMAL LOW (ref 0.61–1.24)
GFR calc Af Amer: >60 mL/min (ref >60)
GLUCOSE: 310 mg/dL — AB (ref 70–99)
Potassium: 3.1 mmol/L — ABNORMAL LOW (ref 3.5–5.1)
Sodium: 136 mmol/L (ref 135–145)

## 2017-11-14 LAB — GLUCOSE, CAPILLARY
GLUCOSE-CAPILLARY: 348 mg/dL — AB (ref 70–99)
GLUCOSE-CAPILLARY: 327 mg/dL — AB (ref 70–99)
GLUCOSE-CAPILLARY: 158 mg/dL — AB (ref 70–99)
Glucose-Capillary: 301 mg/dL — ABNORMAL HIGH (ref 70–99)
Glucose-Capillary: 319 mg/dL — ABNORMAL HIGH (ref 70–99)

## 2017-11-14 LAB — HIV ANTIBODY (ROUTINE TESTING W REFLEX): HIV SCREEN 4TH GENERATION: NONREACTIVE

## 2017-11-14 MED ORDER — INSULIN GLARGINE 100 UNIT/ML ~~LOC~~ SOLN
22.0000 [IU] | Freq: Every day | SUBCUTANEOUS | Status: DC
  Administered 2017-11-14 – 2017-11-15 (×2): 22 [IU] via SUBCUTANEOUS
  Filled 2017-11-14 (×2): qty 0.22

## 2017-11-14 MED ORDER — INSULIN ASPART 100 UNIT/ML ~~LOC~~ SOLN
10.0000 [IU] | Freq: Three times a day (TID) | SUBCUTANEOUS | Status: DC
  Administered 2017-11-14 – 2017-11-15 (×4): 10 [IU] via SUBCUTANEOUS

## 2017-11-14 MED ORDER — POTASSIUM CHLORIDE CRYS ER 20 MEQ PO TBCR
40.0000 meq | EXTENDED_RELEASE_TABLET | Freq: Once | ORAL | Status: AC
  Administered 2017-11-14: 40 meq via ORAL
  Filled 2017-11-14: qty 2

## 2017-11-14 MED ORDER — METFORMIN HCL 500 MG PO TABS
500.0000 mg | ORAL_TABLET | Freq: Two times a day (BID) | ORAL | Status: DC
  Administered 2017-11-14 – 2017-11-15 (×2): 500 mg via ORAL
  Filled 2017-11-14 (×2): qty 1

## 2017-11-14 NOTE — Progress Notes (Addendum)
Inpatient Diabetes Program Recommendations  AACE/ADA: New Consensus Statement on Inpatient Glycemic Control (2015)  Target Ranges:  Prepandial:   less than 140 mg/dL      Peak postprandial:   less than 180 mg/dL (1-2 hours)      Critically ill patients:  140 - 180 mg/dL   Lab Results  Component Value Date   GLUCAP 301 (H) 11/14/2017   HGBA1C 13.5 (H) 11/13/2017    Review of Glycemic Control  Educated patient and spouse on insulin pen use at home.  Reviewed all steps if insulin pen including attachment of needle, 2-unit air shot, dialing up dose, giving injection, removing needle, disposal of sharps, storage of unused insulin, disposal of insulin etc. Patient able to provide successful return demonstration. Also reviewed troubleshooting with insulin pen. MD to give patient Rxs for insulin pens and insulin pen needles.  Pt seems very motivated to control his diabetes. Answered questions.  Continue to titrate insulin.  Thank you. Ailene Ardshonda Nile Prisk, RD, LDN, CDE Inpatient Diabetes Coordinator [phone]

## 2017-11-14 NOTE — Progress Notes (Signed)
CSW consulted to assist patient with discharge [facility] when medically [facility].   CSW attempted to assess patient, patient woke up briefly and had minimal speech. Patient was not able to wake up fully to engage in assessment with CSW. CSW will attempt to see patient at a later time.  CSW will continue to follow and assist with discharge planning.  Celso SickleKimberly Seaira Byus, [place]CSWA Clinical Social Worker [facility] Cell#: [phone]

## 2017-11-14 NOTE — Progress Notes (Signed)
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, metabolic syndrome, chronic ethanolism since age 89, Was at [facility] getting [facility] detox from alcohol and found to be polyuric, polyphagia and ketotic ED work-up showed gap of 14 sugar 460 he was given insulin and referred for admission   A & Plan New onset diabetes mellitus-arranging follow-up-appreciate diabetic coordinator nutritional counseling-he is noncompliant on diabetic foods and has poor knowledge of diabetic compatible diet He will need further education sugars are still in the mid 300 range I will discuss with him in the morning 7030 versus Lantus and bolus basal supplementation Adding metformin 500 twice daily and see tolerance  Ethanolism-has been withdrawing for the past 2 days CIWA is minimal therefore we will discontinue checks  Hypokalemia-replace with K. Dur and recheck labs a.m.  Microcytic anemia, thrombocytopenia-may need outpatient eventual work-up-repeat labs a.m.   Full code, inpatient, not ready for discharge as his sugar is still too high   Mahala MenghiniSamtani, MD  [facility] Direct contact: [phone] --Via amion app OR  --[email or web address]; password [email or web address]  7PM-7AM contact night coverage as above 11/14/2017, 11:43 AM  LOS: 0 days   Consultants:  n  Procedures:  n  Antimicrobials:  n  Interval history/Subjective: Minimally verbal but does interact Blurred vision double vision polyuria seem to be resolved Nursing diabetic educator tells me he is not compliant on diabetic diet and has poor knowledge base however he is able to give himself his insulin reliably and demonstrated reasonable technique apparently  Objective:  Vitals:  Vitals:   11/14/17 0003 11/14/17 0616  BP: (!) 151/103 (!) 139/113  Pulse: 96 92  Resp: 18 20  Temp: 98.6 F (37 C) 98.4 F (36.9 C)   SpO2: 99% 100%    Exam:  Obese pleasant no distress EOMI NCAT Chest clear S1-S2 Abdomen obese No lower extremity edema Did not test small amount filament testing nor did we do retinoscopy  I have personally reviewed the following:   Labs:  Potassium 3.1 glucose 310  Hemoglobin 11.4 platelet 162 MCV 79  Imaging studies:  n  Medical tests:  n   Test discussed with performing physician:  n  Decision to obtain old records:  n  Review and summation of old records:  n  Scheduled Meds: . folic acid  1 mg Oral Daily  . heparin  5,000 Units Subcutaneous Q8H  . insulin aspart  0-15 Units Subcutaneous TID WC  . insulin aspart  0-5 Units Subcutaneous QHS  . insulin aspart  10 Units Subcutaneous TID WC  . insulin glargine  22 Units Subcutaneous Daily  . metFORMIN  500 mg Oral BID WC  . multivitamin with minerals  1 tablet Oral Daily  . thiamine  100 mg Oral Daily   Or  . thiamine  100 mg Intravenous Daily   Continuous Infusions: . sodium chloride 125 mL/hr at 11/14/17 0734    Principal Problem:   Type 2 diabetes mellitus with hyperosmolar nonketotic hyperglycemia (HCC) Active Problems:   Alcohol use   Dehydration   LOS: 0 days

## 2017-11-14 NOTE — Progress Notes (Signed)
Inpatient Diabetes Program Recommendations  AACE/ADA: New Consensus Statement on Inpatient Glycemic Control (2015)  Target Ranges:  Prepandial:   less than 140 mg/dL      Peak postprandial:   less than 180 mg/dL (1-2 hours)      Critically ill patients:  140 - 180 mg/dL   Lab Results  Component Value Date   GLUCAP 327 (H) 11/14/2017   HGBA1C 13.5 (H) 11/13/2017    Review of Glycemic Control  Blood sugars past 24H - 247-460 mg/dL. Received 27 units of Novolog, 18 units of Lantus and  8 units of 70/30 insulin.  Inpatient Diabetes Program Recommendations:     Increase Lantus to 25 units QD Increase Novolog to 10 units tidwc Increase Novolog to 0-20 units tidwc and hs  [facility] and pt will be able to get insulin pens there if he has a scheduled appt. [facility]  70/30 dose would be approx 20 units bid.  Will review pen administration after lunch today.  Thank you. Ailene Ardshonda Shaindy Reader, RD, LDN, CDE Inpatient Diabetes Coordinator [phone]

## 2017-11-14 NOTE — Clinical Social Work Note (Signed)
Clinical Social Work Assessment  Patient Details  Name: Andre Espinoza MRN: [id number] Date of Birth: [birth year]  Date of referral:  11/14/17               Reason for consult:  Discharge Planning                Permission sought to share information with:  Oceanographeracility Contact Representative Permission granted to share information::  Yes, Verbal Permission Granted  Name::        Agency::  [facility]  Relationship::     Contact Information:     Housing/Transportation Living arrangements for the past 2 months:  Single Family Home Source of Information:  Patient Patient Interpreter Needed:  None Criminal Activity/Legal Involvement Pertinent to Current Situation/Hospitalization:  No - Comment as needed Significant Relationships:  (Patient did not disclose) Lives with:  Self Do you feel safe going back to the place where you live?  (Patient plans to [facility] back to [facility]) Need for family participation in patient care:  No (Coment)  Care giving concerns:  Patient reported no care giving concerns. [facility] with principal problem "type 2 diabetes mellitus with hyperosmolar nonketotic hyperglycemia (HCC) - new onset". CSW consulted to assist with discharge planning back to [facility].    Social Worker assessment / plan:  CSW spoke with patient at bedside regarding discharge planning. [facility] and that treatment is going good. Patient reported that he plans to return at discharge.  CSW will follow up with [facility] when patient is medically stable to assist with patient's discharge back.  CSW will continue to follow and assist with discharge planning.  Employment status:  Other (Comment)(Unknown) Insurance information:  Self Pay (Medicaid Pending) PT Recommendations:  Not assessed at this time Information / Referral to community resources:  Other (Comment Required)([facility]  Patient/Family's Response to care:  Patient appreciative of CSW assistance with discharge planning.   Patient/Family's Understanding of and Emotional Response to Diagnosis, Current Treatment, and Prognosis:  Patient presented calm and verbalized understanding of current treatment plan. Patient and CSW briefly discussed patient's new diagnosis of type 2 diabetes. Patient reported that he is receiving psychoeducation about treatment for new diagnosis and feels comfortable with what he has learned thus far. Patient reported that he is ready to [facility] and continue his sobriety. Patient hopeful to discharge soon.   Emotional Assessment Appearance:  Appears stated age Attitude/Demeanor/Rapport:  Other(Cooperative) Affect (typically observed):  Appropriate, Calm Orientation:  Oriented to Self, Oriented to Place, Oriented to  Time, Oriented to Situation Alcohol / Substance use:  Alcohol Use Psych involvement (Current and /or in the community):  No (Comment)  Discharge Needs  Concerns to be addressed:  Care Coordination Readmission within the last 30 days:  No Current discharge risk:  None Barriers to Discharge:  Continued Medical Work up   USG CorporationKimberly L Leevon Upperman, LCSW 11/14/2017, 2:21 PM

## 2017-11-14 NOTE — Care Management Note (Signed)
Case Management Note  Patient Details  Name: Rennis ChrisBryan E Feliz MRN: [id number] Date of Birth: [birth year]  Subjective/Objective: [facility]-plan to return-[facility]-CSW aware. No insurance-[facility] to call for pcp appt(patient will make on own) if d/c over weekend to [facility] Tuesday for [facility]. Patient states he can afford Novolog pen $44.                    Action/Plan:d/c plan back [facility]   Expected Discharge Date:  (unknown)               Expected Discharge Plan:  OP Rehab  In-House Referral:  Clinical Social Work  Discharge planning Services  CM Consult, Indigent Health Clinic  Post Acute Care Choice:    Choice offered to:     DME Arranged:    DME Agency:     HH Arranged:    HH Agency:     Status of Service:  In process, will continue to follow  If discussed at Long Length of Stay Meetings, dates discussed:    Additional Comments:  Lanier ClamMahabir, Isacc Turney, RN 11/14/2017, 1:36 PM

## 2017-11-14 NOTE — Plan of Care (Signed)
  RD consulted for nutrition education regarding diabetes.   Lab Results  Component Value Date   HGBA1C 13.5 (H) 11/13/2017    RD provided "Carbohydrate Counting for People with Diabetes" handout from the Academy of Nutrition and Dietetics. Discussed different food groups and their effects on blood sugar, emphasizing carbohydrate-containing foods. Provided list of carbohydrates and recommended serving sizes of common foods.  Discussed importance of controlled and consistent carbohydrate intake throughout the day. Provided examples of ways to balance meals/snacks and encouraged intake of high-fiber, whole grain complex carbohydrates. Teach back method used.  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 He admits to drinking an excessive amounts of juice but is willing to try sugar free beverages. Discussed how to prepare balanced meals and snacks.   Expect fair compliance.  Body mass index is 28.62 kg/m. Pt meets criteria for overweight based on current BMI.  Current diet order is CM/HH, patient is consuming approximately 75-100% of meals at this time. Labs and medications reviewed. No further nutrition interventions warranted at this time. RD contact information provided. If additional nutrition issues arise, please re-consult RD.  Vanessa Kickarly Neala Miggins RD, LDN Clinical Nutrition Pager # [phone]

## 2017-11-15 LAB — CBC WITH DIFFERENTIAL/PLATELET
Basophils Absolute: 0 10*3/uL (ref 0.0–0.1)
Basophils Relative: 0 %
EOS ABS: 0.1 10*3/uL (ref 0.0–0.7)
EOS PCT: 2 %
HCT: 34.2 % — ABNORMAL LOW (ref 39.0–52.0)
Hemoglobin: 11.3 g/dL — ABNORMAL LOW (ref 13.0–17.0)
LYMPHS ABS: 2.7 10*3/uL (ref 0.7–4.0)
LYMPHS PCT: 48 %
MCH: 26.3 pg (ref 26.0–34.0)
MCHC: 33 g/dL (ref 30.0–36.0)
MCV: 79.7 fL (ref 78.0–100.0)
MONO ABS: 0.4 10*3/uL (ref 0.1–1.0)
Monocytes Relative: 7 %
Neutro Abs: 2.4 10*3/uL (ref 1.7–7.7)
Neutrophils Relative %: 43 %
PLATELETS: 180 10*3/uL (ref 150–400)
RBC: 4.29 MIL/uL (ref 4.22–5.81)
RDW: 14.3 % (ref 11.5–15.5)
WBC: 5.6 10*3/uL (ref 4.0–10.5)

## 2017-11-15 LAB — COMPREHENSIVE METABOLIC PANEL
ALBUMIN: 3 g/dL — AB (ref 3.5–5.0)
ALT: 42 U/L (ref 0–44)
AST: 63 U/L — AB (ref 15–41)
Alkaline Phosphatase: 47 U/L (ref 38–126)
Anion gap: 9 (ref 5–15)
BILIRUBIN TOTAL: 0.5 mg/dL (ref 0.3–1.2)
BUN: 5 mg/dL — AB (ref 6–20)
CO2: 27 mmol/L (ref 22–32)
CREATININE: 0.53 mg/dL — AB (ref 0.61–1.24)
Calcium: 8.5 mg/dL — ABNORMAL LOW (ref 8.9–10.3)
Chloride: 101 mmol/L (ref 98–111)
GFR calc Af Amer: >60 mL/min (ref >60)
GLUCOSE: 283 mg/dL — AB (ref 70–99)
POTASSIUM: 3.7 mmol/L (ref 3.5–5.1)
Sodium: 137 mmol/L (ref 135–145)
Total Protein: 6.4 g/dL — ABNORMAL LOW (ref 6.5–8.1)

## 2017-11-15 LAB — GLUCOSE, CAPILLARY
GLUCOSE-CAPILLARY: 276 mg/dL — AB (ref 70–99)
Glucose-Capillary: 225 mg/dL — ABNORMAL HIGH (ref 70–99)

## 2017-11-15 MED ORDER — INSULIN ISOPHANE & REGULAR (HUMAN 70-30)100 UNIT/ML KWIKPEN: 30.0000 [IU] | PEN_INJECTOR | Freq: Two times a day (BID) | SUBCUTANEOUS | 11 refills | Status: AC

## 2017-11-15 MED ORDER — BLOOD GLUCOSE MONITOR KIT: PACK | 0 refills | Status: AC

## 2017-11-15 MED ORDER — METFORMIN HCL 500 MG PO TABS: 500.0000 mg | ORAL_TABLET | Freq: Two times a day (BID) | ORAL | 1 refills | Status: AC

## 2017-11-15 NOTE — Progress Notes (Signed)
[facility] today.  CSW confirmed patient's ability to return with facility and faxed discharge summary.   [facility] and transport back to facility. CSW provided [facility] with RN's Nehemiah Settle(Brooke) phone number to alert when they arrive.    No more CSW needs, signing off.     Enid CutterLindsey Leiam Hopwood, MSW, LCSWA Clinical Social Work [phone]

## 2017-11-15 NOTE — Discharge Summary (Signed)
Physician Discharge Summary  Andre Espinoza GEX:[id number] DOB: [birth year] DOA: 11/13/2017  PCP: Andre Espinoza, No Pcp Per  Admit date: 11/13/2017 Discharge date: 11/15/2017  Time spent: 45 minutes  Recommendations for Outpatient Follow-up:  1. Andre Espinoza will need titration of insulin and metformin as an outpatient for new onset diabetes mellitus 2. Meter, lancets, supplies as well as insulin and metformin prescription all given for the Andre Espinoza 3. 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, metabolic syndrome, chronic ethanolism since age 40, Was at [facility] getting guided detox from alcohol and found to be polyuric, polyphagia and ketotic ED work-up showed gap of 14 sugar 460 he was given insulin and referred for admission   Hospital Course:  New onset diabetes mellitus- long exhaustive counseling per diabetic nurse as well as myself Final decision regarding insulin seems to be 70/30 insulin which have increased to 30 units twice daily on discharge He has been given a meter I have counseled him exhaustively about the use of metformin therefore Adding metformin 500 twice daily and see tolerance  Ethanolism-has been withdrawing for the past 2 days CIWA is minimal therefore we will discontinue checks  Hypokalemia-replace 3.7 on discharge  Microcytic anemia, thrombocytopenia-no lithium and resolved Will need outpatient colonoscopy at age 87  Body mass index is 28.62 kg/m.needs to lose weight  Discharge Exam: Vitals:   11/15/17 0011 11/15/17 0601  BP: (!) 138/93 (!)  133/99  Pulse: 93 87  Resp: 20 18  Temp: 98.9 F (37.2 C) 98.9 F (37.2 C)  SpO2: 100% 99%    General: Awake alert pleasant no distress Cardiovascular: S1-S2 no murmur rub or gallop Respiratory: Clinically clear no added sound Abdomen soft nontender no rebound no guarding  Discharge Instructions   Discharge Instructions    Ambulatory referral to Nutrition and Diabetic Education   Complete by:  As directed    Diet - low sodium heart healthy   Complete by:  As directed    Increase activity slowly   Complete by:  As directed      Allergies as of 11/15/2017   No Known Allergies     Medication List    STOP taking these medications   predniSONE 50 MG tablet Commonly known as:  DELTASONE     TAKE these medications   blood glucose meter kit and supplies Kit Dispense based on Andre Espinoza and insurance preference. Use up to four times daily as directed. (FOR ICD-9 250.00, 250.01).   erythromycin ophthalmic ointment Place into both eyes at bedtime. Place a 1/2 inch ribbon of ointment into the lower eyelids.   Insulin Isophane & Regular Human (70-30) 100 UNIT/ML PEN Commonly known as:  HUMULIN 70/30 MIX Inject 30 Units into the skin 2 (two) times daily.   ketotifen 0.025 % ophthalmic solution Commonly known as:  ZADITOR Place 2 drops into both eyes 2 (two) times daily.   metFORMIN 500 MG tablet Commonly known as:  GLUCOPHAGE Take 1 tablet (500 mg total) by mouth 2 (two) times daily with a meal.   triamcinolone 55 MCG/ACT Aero nasal inhaler Commonly known as:  NASACORT Place 2  sprays into the nose daily.      No Known Allergies Follow-up Information    [facility]. Schedule an appointment as soon as possible for a visit.   Contact information: [address] [place] [address] [phone]           The results of significant diagnostics from this hospitalization (including imaging, microbiology, ancillary  and laboratory) are listed below for reference.    Significant Diagnostic Studies: No results found.  Microbiology: No results found for this or any previous visit (from the past 240 hour(s)).   Labs: Basic Metabolic Panel: Recent Labs  Lab 11/13/17 0939 11/13/17 1215 11/14/17 0520 11/15/17 0438  NA 134*  --  136 137  K 3.7  --  3.1* 3.7  CL 92*  --  100 101  CO2 28  --  28 27  GLUCOSE 500*  --  310* 283*  BUN <5*  --  <5* 5*  CREATININE 0.69 0.63 0.56* 0.53*  CALCIUM 9.3  --  8.7* 8.5*   Liver Function Tests: Recent Labs  Lab 11/15/17 0438  AST 63*  ALT 42  ALKPHOS 47  BILITOT 0.5  PROT 6.4*  ALBUMIN 3.0*   No results for input(s): LIPASE, AMYLASE in the last 168 hours. No results for input(s): AMMONIA in the last 168 hours. CBC: Recent Labs  Lab 11/13/17 0939 11/13/17 1215 11/14/17 0520 11/15/17 0438  WBC 5.4 6.0 5.8 5.6  NEUTROABS  --   --   --  2.4  HGB 12.4* 12.1* 11.4* 11.3*  HCT 36.8* 36.3* 34.2* 34.2*  MCV 79.0 79.1 79.7 79.7  PLT 174 183 162 180   Cardiac Enzymes: No results for input(s): CKTOTAL, CKMB, CKMBINDEX, TROPONINI in the last 168 hours. BNP: BNP (last 3 results) No results for input(s): BNP in the last 8760 hours.  ProBNP (last 3 results) No results for input(s): PROBNP in the last 8760 hours.  CBG: Recent Labs  Lab 11/14/17 1021 11/14/17 1334 11/14/17 1735 11/14/17 2058 11/15/17 0727  GLUCAP 327* 301* 319* 158* 276*       Signed:  Nita Sells MD   [facility] 11/15/2017, 10:02 AM

## 2017-11-15 NOTE — Progress Notes (Signed)
Educated patient on signs/symptoms of hypoglycemia and hyperglycemia. Patient refuses to watch diabetes videos at this time, will encourage again later.  Earnest ConroyBrooke M. Clelia CroftShaw, RN

## 2017-11-15 NOTE — Progress Notes (Signed)
Reviewed discharge information with patient .Answered all questions. Pt able to teach back medications and reasons to contact MD/911. Patient verbalizes importance of PCP follow up appointment at [facility]. Patient verbalizes and demonstrates understanding of using insulin pen and administering subq injection of insulin. Verbalizes understanding of taking home blood sugars and can teach back symptoms and treatments  of hypo and hyperglycemia.   Earnest ConroyBrooke M. Clelia CroftShaw, RN

## 2018-05-16 ENCOUNTER — Other Ambulatory Visit: Payer: Self-pay

## 2018-05-16 ENCOUNTER — Encounter: Payer: Self-pay | Admitting: Gynecology

## 2018-05-16 ENCOUNTER — Ambulatory Visit (INDEPENDENT_AMBULATORY_CARE_PROVIDER_SITE_OTHER): Payer: Self-pay

## 2018-05-16 ENCOUNTER — Ambulatory Visit
Admission: EM | Admit: 2018-05-16 | Discharge: 2018-05-16 | Disposition: A | Discharge status: Home / Self Care | Payer: Self-pay | Attending: Family Medicine | Admitting: Family Medicine

## 2018-05-16 DIAGNOSIS — E119 Type 2 diabetes mellitus without complications: Secondary | ICD-10-CM

## 2018-05-16 DIAGNOSIS — S60221A Contusion of right hand, initial encounter: Secondary | ICD-10-CM

## 2018-05-16 DIAGNOSIS — W2209XA Striking against other stationary object, initial encounter: Secondary | ICD-10-CM

## 2018-05-16 DIAGNOSIS — S62346A Nondisplaced fracture of base of fifth metacarpal bone, right hand, initial encounter for closed fracture: Secondary | ICD-10-CM

## 2018-05-16 DIAGNOSIS — S62339A Displaced fracture of neck of unspecified metacarpal bone, initial encounter for closed fracture: Secondary | ICD-10-CM

## 2018-05-16 HISTORY — DX: Type 2 diabetes mellitus without complications: E11.9

## 2018-05-16 HISTORY — DX: Other psychoactive substance abuse, uncomplicated: F19.10

## 2018-05-16 LAB — GLUCOSE, CAPILLARY: GLUCOSE-CAPILLARY: 88 mg/dL (ref 70–99)

## 2018-05-16 NOTE — Discharge Instructions (Addendum)
Ice. Keep in splint. Rest. Drink plenty of fluids. Follow up with orthopedic this week as discussed.  See above to call to schedule.  Also monitoring your blood sugar and taking medication as discussed and recommended by your primary doctor.  Follow-up with your doctor as soon as possible regarding your blood sugar.  Return to Urgent care for new or worsening concerns.

## 2018-05-16 NOTE — ED Triage Notes (Signed)
Per patient use his right hand to punch a door x 2 days ago. Patient present with right hand swollen and painful. Per patient has history of diabetes and would like to have test. Per patient in rehab for drug abuse.

## 2018-05-16 NOTE — ED Provider Notes (Signed)
[facility] ____________________________________________  Time seen: Approximately 12:23 PM  I have reviewed the triage vital signs and the nursing notes.   HISTORY  Chief Complaint Hand Injury   HPI Andre Espinoza is a 38 y.o. male history of diabetes, presenting for evaluation of right hand pain after injury.  States 2 days ago he became frustrated and punched a wall with his right hand.  Denies any other injuries.  Right-hand-dominant.  States he has had pain to the side of his right hand since injury.  Occasional ice.  Denies other alleviating factors.  Pain worsens with palpation and grabbing items.  Denies pain radiation, paresthesias or decreased range of motion.  Patient also requests for blood sugar to be checked at this time.  Patient states he is a diabetic takes metformin 1000 mg twice a day, but reports he does not take the metformin as prescribed because of diarrhea.  States he takes it intermittently.  Also states that he has misplaced his glucometer at home and just wanted to check his blood sugar.  Denies chest pain or shortness of breath.  Reports otherwise been doing well.  PCP: Westley Gambles    Past Medical History:  Diagnosis Date  . Diabetes mellitus without complication ([place])   . Drug abuse [facility])     Patient Active Problem List   Diagnosis Date Noted  . DKA (diabetic ketoacidoses) ([place]) 11/14/2017  . Type 2 diabetes mellitus with hyperosmolar nonketotic hyperglycemia ([place]) 11/13/2017  . Alcohol use 11/13/2017  . Dehydration 11/13/2017    Past Surgical History:  Procedure Laterality Date  . HAND SURGERY Left   . HAND SURGERY       No current facility-administered medications for this encounter.   Current Outpatient Medications:  .  metFORMIN (GLUCOPHAGE) 500 MG tablet, Take 1 tablet (500 mg total) by mouth 2 (two) times daily with a meal., Disp: 60 tablet, Rfl: 1 .  blood glucose meter kit and supplies KIT, Dispense based on patient and  insurance preference. Use up to four times daily as directed. (FOR ICD-9 250.00, 250.01)., Disp: 1 each, Rfl: 0 .  erythromycin ophthalmic ointment, Place into both eyes at bedtime. Place a 1/2 inch ribbon of ointment into the lower eyelids. (Patient not taking: Reported on 11/13/2017), Disp: 3.5 g, Rfl: 0 .  Insulin Isophane & Regular Human (HUMULIN 70/30 MIX) (70-30) 100 UNIT/ML PEN, Inject 30 Units into the skin 2 (two) times daily., Disp: 15 mL, Rfl: 11 .  ketotifen (ZADITOR) 0.025 % ophthalmic solution, Place 2 drops into both eyes 2 (two) times daily. (Patient not taking: Reported on 11/13/2017), Disp: 10 mL, Rfl: 0 .  triamcinolone (NASACORT AQ) 55 MCG/ACT AERO nasal inhaler, Place 2 sprays into the nose daily. (Patient not taking: Reported on 11/13/2017), Disp: 1 Inhaler, Rfl: 0  Allergies Patient has no known allergies.  Family History  Problem Relation Age of Onset  . Hypertension Mother   . Hypertension Father     Social History Social History   Tobacco Use  . Smoking status: Current Every Day Smoker    Types: Cigarettes  . Smokeless tobacco: Never Used  Substance Use Topics  . Alcohol use: Yes  . Drug use: Yes    Types: Cocaine, Marijuana    Review of Systems Constitutional: No fever Cardiovascular: Denies chest pain. Respiratory: Denies shortness of breath. Gastrointestinal: No abdominal pain.  Musculoskeletal: Positive right hand pain.   ____________________________________________   PHYSICAL EXAM:  VITAL SIGNS: ED Triage Vitals  Enc  Vitals Group     BP 05/16/18 1038 (!) 164/115     Pulse Rate 05/16/18 1038 77     Resp 05/16/18 1038 18     Temp 05/16/18 1038 98.1 F (36.7 C)     Temp Source 05/16/18 1038 Oral     SpO2 05/16/18 1038 100 %     Weight 05/16/18 1039 200 lb (90.7 kg)     Height 05/16/18 1039 '5\' 8"'$  (1.727 m)     Head Circumference --      Peak Flow --      Pain Score 05/16/18 1038 10     Pain Loc --      Pain Edu? --      Excl. in [place]? --     Vitals:   05/16/18 1038 05/16/18 1039 05/16/18 1227  BP: (!) 164/115  (!) 160/100  Pulse: 77    Resp: 18    Temp: 98.1 F (36.7 C)    TempSrc: Oral    SpO2: 100%    Weight:  200 lb (90.7 kg)   Height:  '5\' 8"'$  (1.727 m)      Constitutional: Alert and oriented. Well appearing and in no acute distress. ENT      Head: Normocephalic and atraumatic. Cardiovascular: Normal rate, regular rhythm. Grossly normal heart sounds.  Good peripheral circulation. Respiratory: Normal respiratory effort without tachypnea nor retractions. Breath sounds are clear and equal bilaterally. No wheezes, rales, rhonchi. Musculoskeletal: Steady gait.  Bilateral distal radial pulses equal and easily palpated. Except: Right hand mid to distal fifth metacarpal mild to moderate tenderness to palpation with minimal localized edema, right hand otherwise nontender, right hand with full range of motion present, no motor or tendon deficit noted, normal distal sensation and capillary refill. Neurologic:  Normal speech and language.Speech is normal. No gait instability.  Skin:  Skin is warm, dry and intact. No rash noted. Psychiatric: Mood and affect are normal. Speech and behavior are normal. Patient exhibits appropriate insight and judgment   ___________________________________________   LABS (all labs ordered are listed, but only abnormal results are displayed)  Labs Reviewed  GLUCOSE, CAPILLARY  CBG MONITORING, ED   RADIOLOGY  Dg Hand Complete Right  Result Date: 05/16/2018 CLINICAL DATA:  Punching injury with fifth digit pain, initial encounter EXAM: RIGHT HAND - COMPLETE 3+ VIEW COMPARISON:  None. FINDINGS: Mildly angulated distal fifth metacarpal fracture is seen. No other fracture or dislocation is noted. Mild soft tissue swelling is noted as well. IMPRESSION: Boxer's fracture in the distal fifth metacarpal. Electronically Signed   By: Inez Catalina M.D.   On: 05/16/2018 11:04    ____________________________________________   PROCEDURES Procedures    INITIAL IMPRESSION / ASSESSMENT AND PLAN / ED COURSE  Pertinent labs & imaging results that were available during my care of the patient were reviewed by me and considered in my medical decision making (see chart for details).  Well-appearing patient.  No acute distress.  Right hand x-ray as above per radiologist, boxer's fracture in the distal fifth metacarpal.  Patient placed in ulnar gutter OCL splint.  Recommend ice, elevation, Tylenol ibuprofen as needed.  Follow-up with orthopedic this coming week.  Work note given for today.  Patient blood sugar 88.  Also blood pressure elevated today in office.  Patient states that this is not his norm.  Recommend for follow-up with his primary care this week regarding his intolerance of his metformin as well as monitoring blood pressure.  Supportive care.  Discussed follow  up with Primary care physician this week. Discussed follow up and return parameters including no resolution or any worsening concerns. Patient verbalized understanding and agreed to plan.   ____________________________________________   FINAL CLINICAL IMPRESSION(S) / ED DIAGNOSES  Final diagnoses:  Closed boxer's fracture, initial encounter  Contusion of right hand, initial encounter  Type 2 diabetes mellitus without complication, without long-term current use of insulin [facility])     ED Discharge Orders    None       Note: This dictation was prepared with Dragon dictation along with smaller phrase technology. Any transcriptional errors that result from this process are unintentional.         Marylene Land, NP 05/16/18 1419

## 2019-06-05 ENCOUNTER — Ambulatory Visit: Payer: Self-pay | Attending: Internal Medicine

## 2019-06-05 DIAGNOSIS — Z23 Encounter for immunization: Secondary | ICD-10-CM

## 2019-06-05 NOTE — Progress Notes (Signed)
   Covid-19 Vaccination Clinic  Name:  Andre Espinoza    MRN: [id number] DOB: [birth year]  06/05/2019  [name] was observed post Covid-19 immunization for 15 minutes without incident. He was provided with Vaccine Information Sheet and instruction to access the V-Safe system.   [name] was instructed to call 911 with any severe reactions post vaccine: Marland Kitchen Difficulty breathing  . Swelling of face and throat  . A fast heartbeat  . A bad rash all over body  . Dizziness and weakness   Immunizations Administered    Name Date Dose VIS Date Route   Pfizer COVID-19 Vaccine 06/05/2019 10:00 AM 0.3 mL 02/26/2019 Intramuscular   Manufacturer: [employer], [employer]   Lot: [id number]   NDC: [address]-5

## 2019-06-26 ENCOUNTER — Ambulatory Visit: Payer: Self-pay | Attending: Internal Medicine

## 2019-06-26 DIAGNOSIS — Z23 Encounter for immunization: Secondary | ICD-10-CM

## 2019-06-26 NOTE — Progress Notes (Signed)
   Covid-19 Vaccination Clinic  Name:  Andre Espinoza    MRN: [id number] DOB: [birth year]  06/26/2019  Andre Espinoza was observed post Covid-19 immunization for 15 minutes without incident. He was provided with Vaccine Information Sheet and instruction to access the V-Safe system.   Andre Espinoza was instructed to call 911 with any severe reactions post vaccine: Marland Kitchen Difficulty breathing  . Swelling of face and throat  . A fast heartbeat  . A bad rash all over body  . Dizziness and weakness   Immunizations Administered    Name Date Dose VIS Date Route   Pfizer COVID-19 Vaccine 06/26/2019  9:55 AM 0.3 mL 02/26/2019 Intramuscular   Manufacturer: [employer], [employer]   Lot: [id number]   NDC: [address]-4

## 2020-10-03 IMAGING — CR DG HAND COMPLETE 3+V*R*
3 series · 3 of 3 positions shown · non-contrast
Comparison: None.

CLINICAL DATA: Punching injury with fifth digit pain, initial
encounter

EXAM:
RIGHT HAND - COMPLETE 3+ VIEW

[hand ap]
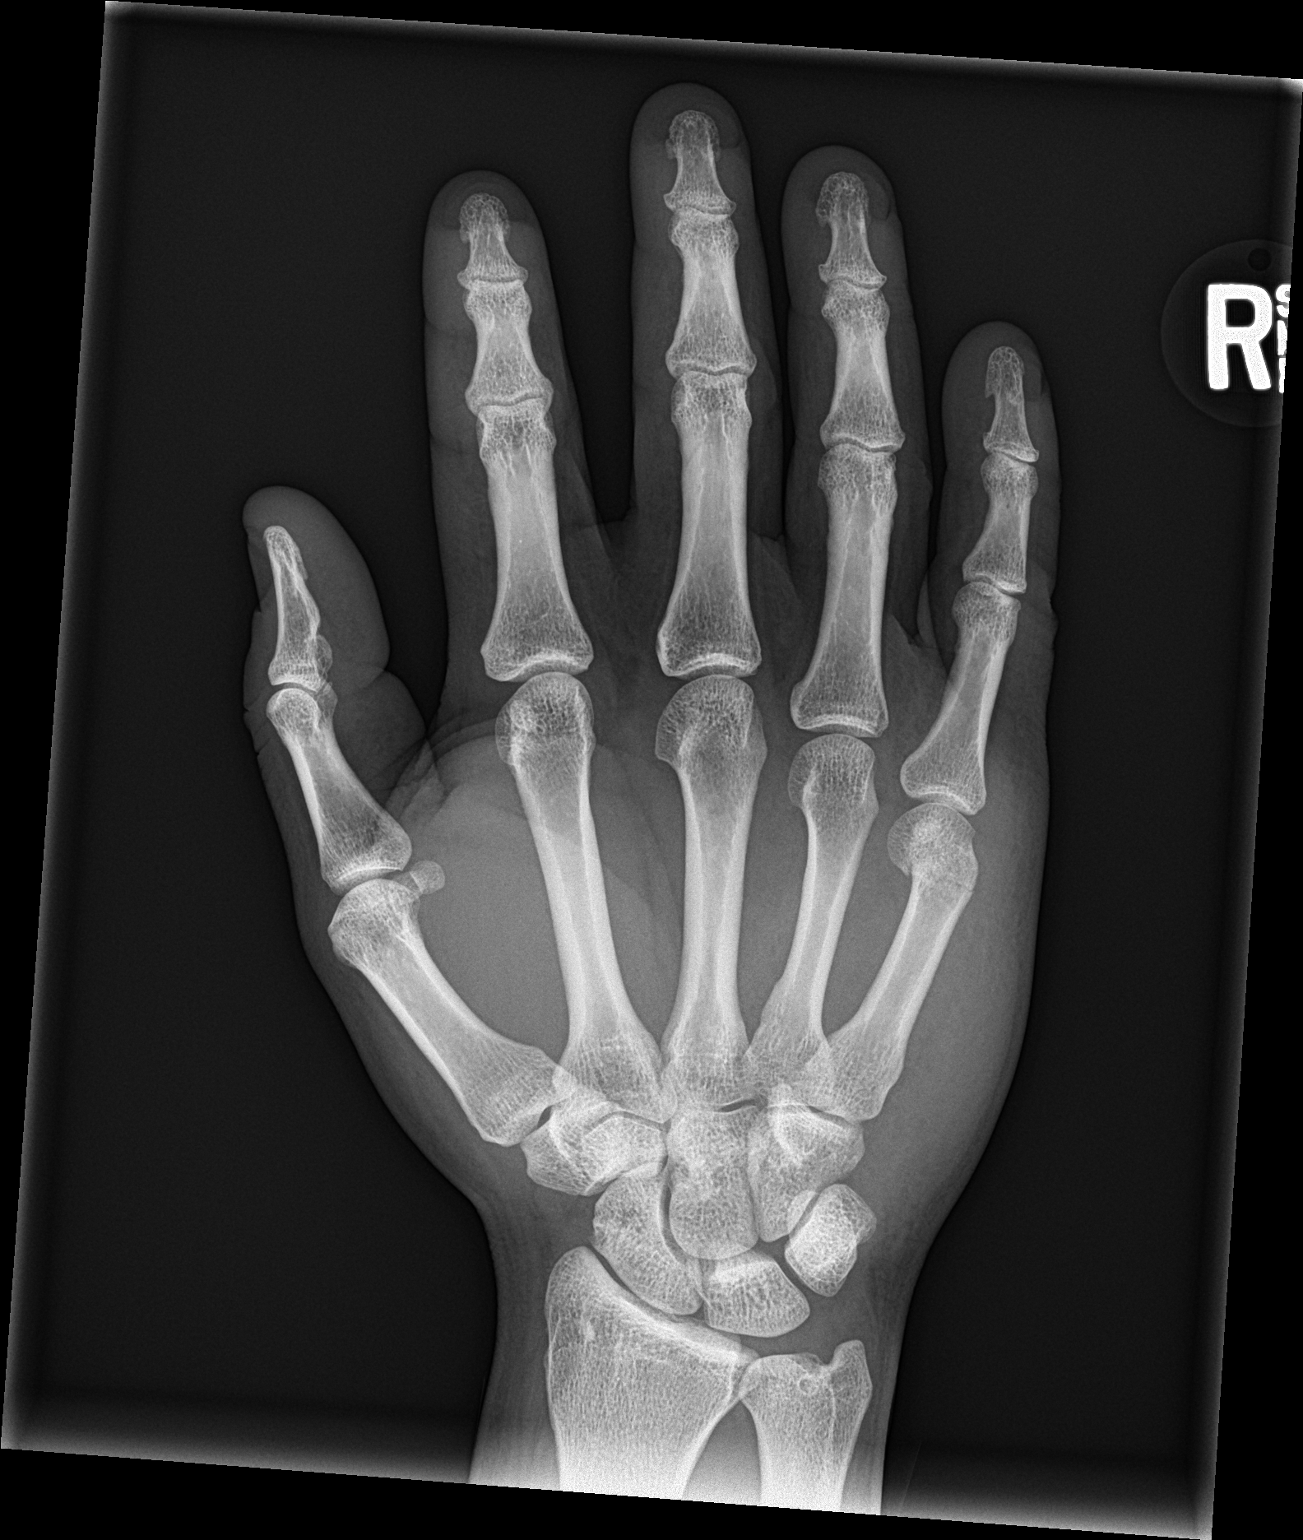

[hand obl]
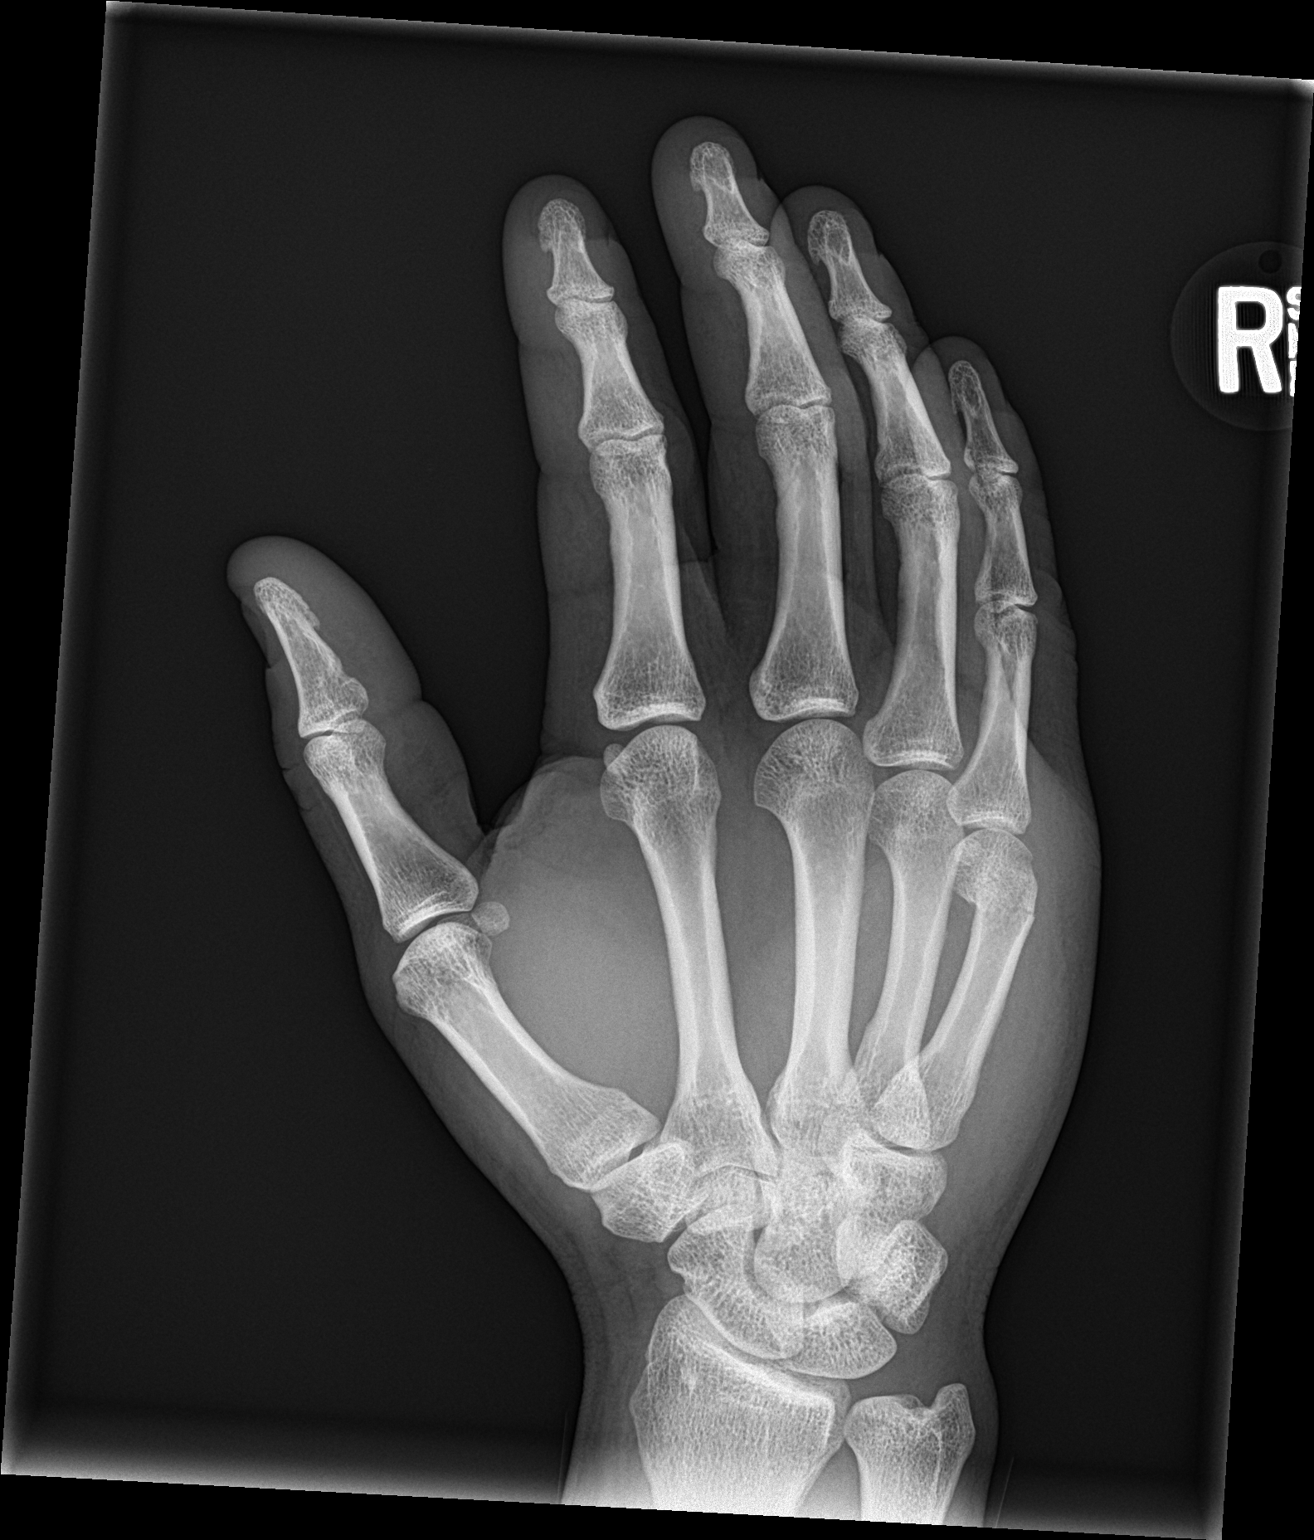

[hand lat]
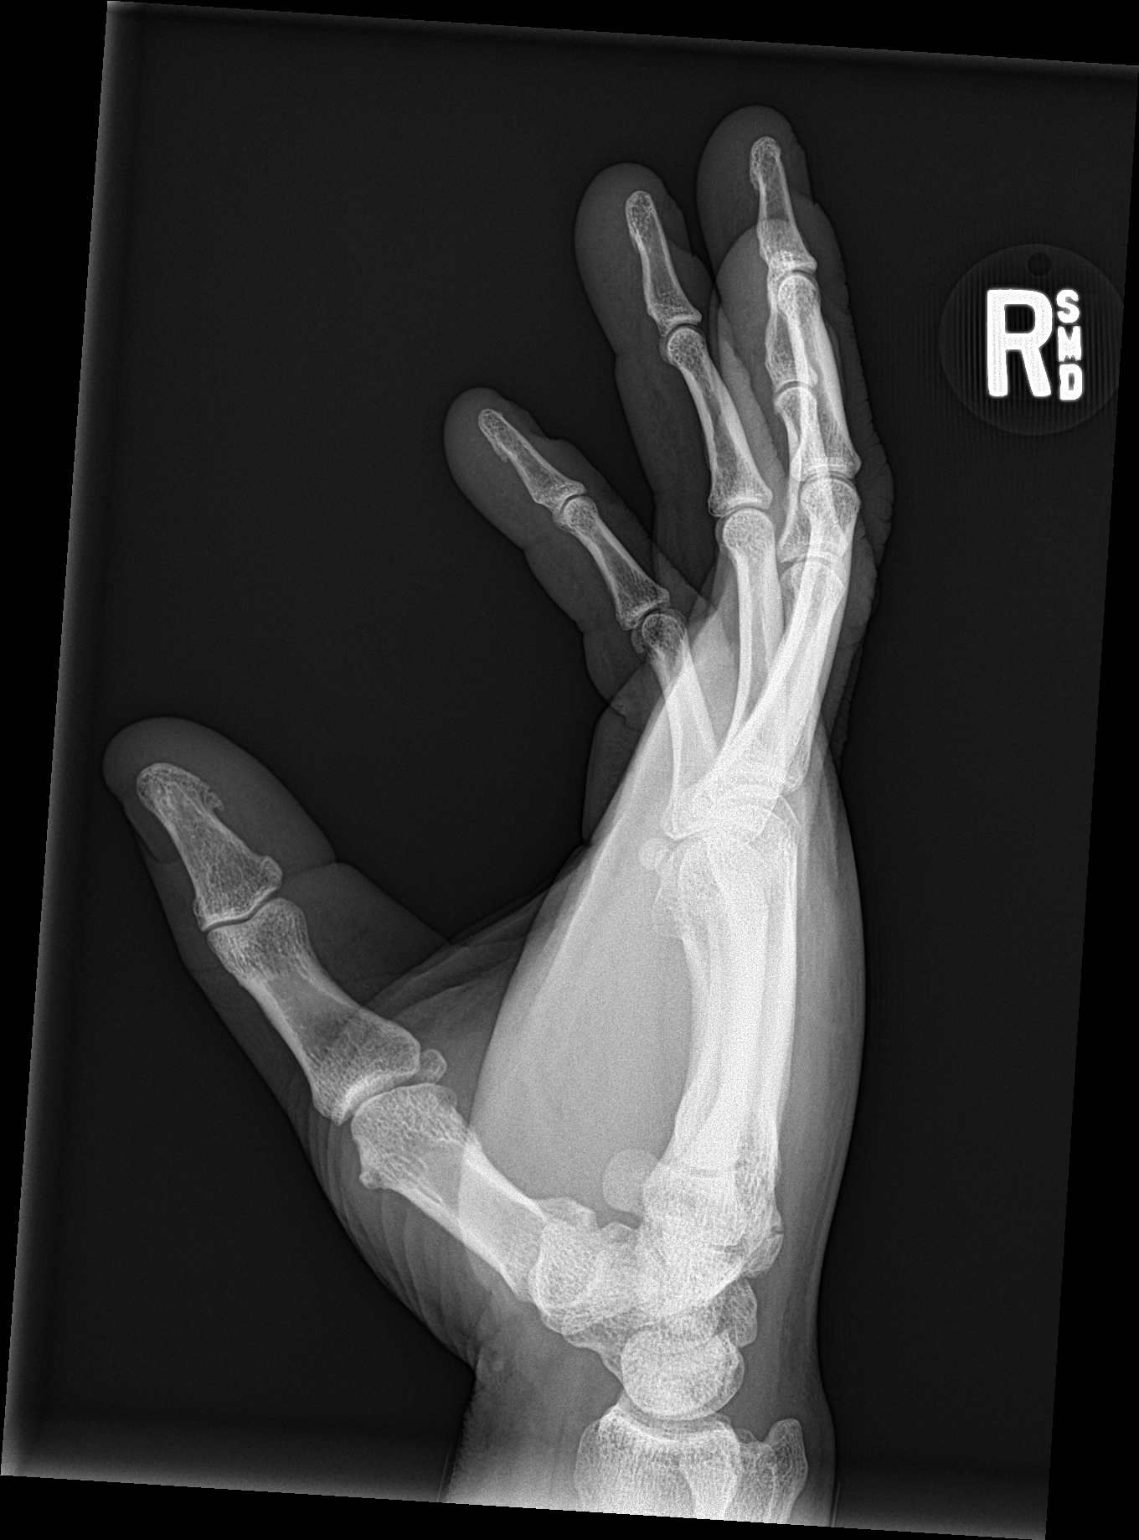

[3 of 3 positions shown; findings below may reference images not displayed]

FINDINGS: Mildly angulated distal fifth metacarpal fracture is seen. No other
fracture or dislocation is noted. Mild soft tissue swelling is noted
as well.
IMPRESSION: Boxer's fracture in the distal fifth metacarpal.
